# Patient Record
Sex: Female | Born: 1970 | Race: Asian | Hispanic: No | Marital: Married | Smoking: Never smoker
Health system: Southern US, Community
[De-identification: ages and names within clinical notes are randomized; demographics above are authoritative.]

## PROBLEM LIST (undated history)

## (undated) DIAGNOSIS — M25561 Pain in right knee: Secondary | ICD-10-CM

## (undated) DIAGNOSIS — K219 Gastro-esophageal reflux disease without esophagitis: Secondary | ICD-10-CM

## (undated) HISTORY — DX: Gastro-esophageal reflux disease without esophagitis: K21.9

## (undated) HISTORY — DX: Pain in right knee: M25.561

---

## 2015-09-19 ENCOUNTER — Encounter: Payer: Self-pay | Admitting: Osteopathic Medicine

## 2015-09-19 ENCOUNTER — Ambulatory Visit (INDEPENDENT_AMBULATORY_CARE_PROVIDER_SITE_OTHER): Payer: 59 | Admitting: Osteopathic Medicine

## 2015-09-19 VITALS — BP 132/76 | HR 90 | Ht 61.0 in | Wt 109.0 lb

## 2015-09-19 DIAGNOSIS — K219 Gastro-esophageal reflux disease without esophagitis: Secondary | ICD-10-CM

## 2015-09-19 DIAGNOSIS — Z23 Encounter for immunization: Secondary | ICD-10-CM

## 2015-09-19 DIAGNOSIS — M25561 Pain in right knee: Secondary | ICD-10-CM

## 2015-09-19 MED ORDER — FAMOTIDINE 40 MG PO TABS: 20.0000 mg | ORAL_TABLET | Freq: Two times a day (BID) | ORAL | Status: AC

## 2015-09-19 NOTE — Progress Notes (Signed)
HPI: Sara Hansen is a 44 y.o. female who presents to [place]  today for chief complaint of: HEE-YEN  Chief Complaint  Patient presents with  . Gastrophageal Reflux   . Location: epigastric pain . Quality: burning . Severity: moderate to severe . Duration: last few week . Timing: . Context: . Modifying factors: has been taking Nexium, would like a prescription for famotidine which was given to her by a friend and helped . Assoc signs/symptoms: no unusual foods or recent travel   R knee pain as well No previous injury Worse with getting up and walking. Duration: 1 month.  No falls.  Has taken aleve OTC for this.    Past medical, social and family history reviewed: History reviewed. No pertinent past medical history. History reviewed. No pertinent past surgical history. Social History  Substance Use Topics  . Smoking status: Not on file  . Smokeless tobacco: Not on file  . Alcohol Use: Not on file   History reviewed. No pertinent family history.   No Known Allergies    Review of Systems: CONSTITUTIONAL: Neg fever/chills, no unintentional weight changes HEAD/EYES/EARS/NOSE/THROAT: No headache/vision change or hearing change, no sore throat CARDIAC: No chest pain/pressure/palpitations, no orthopnea RESPIRATORY: No cough/shortness of breath/wheeze GASTROINTESTINAL: No nausea/vomiting/abdominal pain/blood in stool/diarrhea/constipation/coffee ground emesis. (+) HEARTBURN AS PER HPI, was initially having some vomiting however this has resolved, no episodes this past week MUSCULOSKELETAL: (+) myalgia/arthralgia r knee pain as per HPI GENITOURINARY: No incontinence, No abnormal genital bleeding/discharge SKIN: No rash/wounds/concerning lesions HEM/ONC: No easy bruising/bleeding, no abnormal lymph node ENDOCRINE: No polyuria/polydipsia/polyphagia, no heat/cold intolerance  NEUROLOGIC: No weakness/dizzines/slurred speech PSYCHIATRIC: No  concerns with depression/anxiety or sleep problems.    Exam:  BP 132/76 mmHg  Pulse 90  Ht 5' 1"  (1.549 m)  Wt 109 lb (49.442 kg)  BMI 20.61 kg/m2  Constitutional: VSS, see above. General Appearance: alert, well-developed, well-nourished, NAD Eyes: Normal lids and conjunctive, non-icteric sclera,  Respiratory: Normal respiratory effort. no wheeze/rhonchi/rales Cardiovascular: S1/S2 normal, no murmur/rub/gallop auscultated. RRR.  Gastrointestinal: Nontender, no masses. No hepatomegaly, no splenomegaly. No hernia appreciated. Bowel sounds normal. Rectal exam deferred.  Musculoskeletal: Gait normal. No clubbing/cyanosis of digits. R knee neg anterior drawer, neg lachman's, (-) valgus/varus stress, (-) McMurry (+) mild crepitus with flexion.  Neurological: No cranial nerve deficit on limited exam. Motor and sensation intact and symmetric Psychiatric: Normal judgment/insight. Normal mood and affect. Oriented x3.    No results found for this or any previous visit (from the past 72 hour(s)).    ASSESSMENT/PLAN:  Gastroesophageal reflux disease, esophagitis presence not specified - Plan: famotidine (PEPCID) 40 MG tablet  Right knee pain - Plan: DG Knee Complete 4 Views Right   Famotidine per pt request. Has taken PPI so can't do breath test today but would consider this vs PPI therapy if H2B not effective, advised bid use of 1/2 tabs, printed info given  Pt will think about getting knee XR today. Advised most likely arthritis, avoid OTC NSAID as these can irritate stomach

## 2015-09-19 NOTE — Patient Instructions (Signed)
IF YOUR HEARTBURN DOESN'T GET BETTER, PLEASE COME BACK TO CLINIC TO TALK MORE ABOUT OTHER MEDICATIONS OR TESTS. OTHERWISE, CAN FOLLOW UP FOR ANNUAL PHYSICAL IN THE NEXT 1 - 2 MONTHS. TAKE YOUR HEARTBURN MEDICATION AS DIRECTED FOR AT LEAST 8 - 12 WEEKS TO SEE IF THI WILL CUT DOWN ON THE STOMACH ACID AND MAKE YOUR HEARTBURN GO AWAY.    Gastroesophageal Reflux Disease, Adult Gastroesophageal reflux disease (GERD) happens when acid from your stomach goes into your food pipe (esophagus). The acid can cause a burning feeling in your chest. Over time, the acid can make small holes (ulcers) in your food pipe.  HOME CARE  Ask your doctor for advice about:  Losing weight.  Quitting smoking.  Alcohol use.  Avoid foods and drinks that make your problems worse. You may want to avoid:  Caffeine and alcohol.  Chocolate.  Mints.  Garlic and onions.  Spicy foods.  Citrus fruits, such as oranges, lemons, or limes.  Foods that contain tomato, such as sauce, chili, salsa, and pizza.  Fried and fatty foods.  Avoid lying down for 3 hours before you go to bed or before you take a nap.  Eat small meals often, instead of large meals.  Wear loose-fitting clothing. Do not wear anything tight around your waist.  Raise (elevate) the head of your bed 6 to 8 inches with wood blocks. Using extra pillows does not help.  Only take medicines as told by your doctor.  Do not take aspirin or ibuprofen. GET HELP RIGHT AWAY IF:   You have pain in your arms, neck, jaw, teeth, or back.  Your pain gets worse or changes.  You feel sick to your stomach (nauseous), throw up (vomit), or sweat (diaphoresis).  You feel short of breath, or you pass out (faint).  Your throw up is green, yellow, black, or looks like coffee grounds or blood.  Your poop (stool) is red, bloody, or black. MAKE SURE YOU:   Understand these instructions.  Will watch your condition.  Will get help right away if you are not doing  well or get worse. Document Released: 05/26/2008 Document Revised: 03/01/2012 Document Reviewed: 06/27/2011 [facility] Patient Information 2015 [place], [place]. This information is not intended to replace advice given to you by your health care provider. Make sure you discuss any questions you have with your health care provider.

## 2015-09-20 ENCOUNTER — Encounter: Payer: Self-pay | Admitting: Osteopathic Medicine

## 2015-09-20 DIAGNOSIS — M25561 Pain in right knee: Secondary | ICD-10-CM

## 2015-09-20 DIAGNOSIS — K219 Gastro-esophageal reflux disease without esophagitis: Secondary | ICD-10-CM

## 2015-09-20 HISTORY — DX: Gastro-esophageal reflux disease without esophagitis: K21.9

## 2015-09-20 HISTORY — DX: Pain in right knee: M25.561

## 2015-11-23 ENCOUNTER — Encounter: Payer: Self-pay | Admitting: Osteopathic Medicine

## 2015-11-23 ENCOUNTER — Ambulatory Visit (INDEPENDENT_AMBULATORY_CARE_PROVIDER_SITE_OTHER): Payer: 59 | Admitting: Osteopathic Medicine

## 2015-11-23 ENCOUNTER — Other Ambulatory Visit (HOSPITAL_COMMUNITY)
Admission: RE | Admit: 2015-11-23 | Discharge: 2015-11-23 | Disposition: A | Discharge status: Home / Self Care | Payer: 59 | Source: Ambulatory Visit | Attending: Osteopathic Medicine | Admitting: Osteopathic Medicine

## 2015-11-23 ENCOUNTER — Ambulatory Visit (INDEPENDENT_AMBULATORY_CARE_PROVIDER_SITE_OTHER): Payer: 59

## 2015-11-23 VITALS — BP 136/67 | HR 76 | Ht 61.0 in | Wt 108.0 lb

## 2015-11-23 DIAGNOSIS — Z113 Encounter for screening for infections with a predominantly sexual mode of transmission: Secondary | ICD-10-CM | POA: Insufficient documentation

## 2015-11-23 DIAGNOSIS — D509 Iron deficiency anemia, unspecified: Secondary | ICD-10-CM

## 2015-11-23 DIAGNOSIS — Z01419 Encounter for gynecological examination (general) (routine) without abnormal findings: Secondary | ICD-10-CM | POA: Diagnosis present

## 2015-11-23 DIAGNOSIS — M25561 Pain in right knee: Secondary | ICD-10-CM

## 2015-11-23 DIAGNOSIS — N760 Acute vaginitis: Secondary | ICD-10-CM | POA: Diagnosis present

## 2015-11-23 DIAGNOSIS — Z13220 Encounter for screening for lipoid disorders: Secondary | ICD-10-CM

## 2015-11-23 DIAGNOSIS — Z124 Encounter for screening for malignant neoplasm of cervix: Secondary | ICD-10-CM

## 2015-11-23 DIAGNOSIS — Z23 Encounter for immunization: Secondary | ICD-10-CM

## 2015-11-23 DIAGNOSIS — Z1151 Encounter for screening for human papillomavirus (HPV): Secondary | ICD-10-CM | POA: Insufficient documentation

## 2015-11-23 DIAGNOSIS — L409 Psoriasis, unspecified: Secondary | ICD-10-CM | POA: Insufficient documentation

## 2015-11-23 DIAGNOSIS — Z131 Encounter for screening for diabetes mellitus: Secondary | ICD-10-CM

## 2015-11-23 DIAGNOSIS — Z114 Encounter for screening for human immunodeficiency virus [HIV]: Secondary | ICD-10-CM

## 2015-11-23 DIAGNOSIS — B379 Candidiasis, unspecified: Secondary | ICD-10-CM

## 2015-11-23 DIAGNOSIS — Z1239 Encounter for other screening for malignant neoplasm of breast: Secondary | ICD-10-CM

## 2015-11-23 DIAGNOSIS — Z0000 Encounter for general adult medical examination without abnormal findings: Secondary | ICD-10-CM | POA: Diagnosis not present

## 2015-11-23 DIAGNOSIS — Z79899 Other long term (current) drug therapy: Secondary | ICD-10-CM

## 2015-11-23 DIAGNOSIS — Z862 Personal history of diseases of the blood and blood-forming organs and certain disorders involving the immune mechanism: Secondary | ICD-10-CM

## 2015-11-23 LAB — COMPLETE METABOLIC PANEL WITH GFR
ALT: 19 U/L (ref 6–29)
AST: 17 U/L (ref 10–30)
Albumin: 4.4 g/dL (ref 3.6–5.1)
Alkaline Phosphatase: 89 U/L (ref 33–115)
BILIRUBIN TOTAL: 1 mg/dL (ref 0.2–1.2)
BUN: 15 mg/dL (ref 7–25)
CHLORIDE: 107 mmol/L (ref 98–110)
CO2: 27 mmol/L (ref 20–31)
CREATININE: 0.4 mg/dL — AB (ref 0.50–1.10)
Calcium: 8.8 mg/dL (ref 8.6–10.2)
GFR, Est African American: >89 mL/min (ref >=60)
GFR, Est Non African American: >89 mL/min (ref >=60)
Glucose, Bld: 81 mg/dL (ref 65–99)
Potassium: 3.8 mmol/L (ref 3.5–5.3)
Sodium: 139 mmol/L (ref 135–146)
TOTAL PROTEIN: 6.9 g/dL (ref 6.1–8.1)

## 2015-11-23 LAB — CBC WITH DIFFERENTIAL/PLATELET
BASOS ABS: 0 10*3/uL (ref 0.0–0.1)
Basophils Relative: 1 % (ref 0–1)
EOS PCT: 1 % (ref 0–5)
Eosinophils Absolute: 0 10*3/uL (ref 0.0–0.7)
HEMATOCRIT: 26.5 % — AB (ref 36.0–46.0)
Hemoglobin: 7.2 g/dL — ABNORMAL LOW (ref 12.0–15.0)
LYMPHS ABS: 1.4 10*3/uL (ref 0.7–4.0)
LYMPHS PCT: 30 % (ref 12–46)
MCH: 16.5 pg — ABNORMAL LOW (ref 26.0–34.0)
MCHC: 27.2 g/dL — ABNORMAL LOW (ref 30.0–36.0)
MCV: 60.8 fL — ABNORMAL LOW (ref 78.0–100.0)
MONO ABS: 0.6 10*3/uL (ref 0.1–1.0)
MPV: 7.9 fL — ABNORMAL LOW (ref 8.6–12.4)
Monocytes Relative: 13 % — ABNORMAL HIGH (ref 3–12)
NEUTROS ABS: 2.5 10*3/uL (ref 1.7–7.7)
Neutrophils Relative %: 55 % (ref 43–77)
PLATELETS: 551 10*3/uL — AB (ref 150–400)
RBC: 4.36 MIL/uL (ref 3.87–5.11)
RDW: 18.8 % — AB (ref 11.5–15.5)
WBC: 4.6 10*3/uL (ref 4.0–10.5)

## 2015-11-23 LAB — LIPID PANEL
CHOL/HDL RATIO: 1.8 ratio (ref <=5.0)
Cholesterol: 168 mg/dL (ref 125–200)
HDL: 96 mg/dL (ref >=46)
LDL CALC: 63 mg/dL (ref <130)
Triglycerides: 45 mg/dL (ref <150)
VLDL: 9 mg/dL (ref <30)

## 2015-11-23 MED ORDER — FLUCONAZOLE 150 MG PO TABS: 150.0000 mg | ORAL_TABLET | Freq: Once | ORAL | Status: DC

## 2015-11-23 NOTE — Progress Notes (Signed)
HPI: Sara Hansen is a 44 y.o. female who presents to [place]  today for chief complaint of:  Chief Complaint  Patient presents with  . Annual Exam  . Gynecologic Exam   FEMALE PREVENTIVE CARE  ANNUAL SCREENING/COUNSELING Tobacco - Never  Alcohol - social drinker Diet/Exercise - HEALTHY HABITS DISCUSSED TO DECREASE CV RISK, patient walks and runs sometimes Sexual Health - No, not currently STI - The patient denies history of sexually transmitted disease. INTERESTED IN STI TESTING - yes Depression - PQH2 Negative Domestic violence concerns - no HTN SCREENING - SEE VITALS Vaccination status - SEE BELOW G3P3, no history GDM or GHTN  INFECTIOUS DISEASE SCREENING HIV - all adults 15-65 - needs GC/CT - sexually active - needs HepC - born 37-1965 - does not need TB - if risk/required by employer - does not need  DISEASE SCREENING Lipid - (Low risk screen M35/F45; High risk screen M25/F35 if HTN, Tob, FH CHD M<55/F<65) - needs DM2 (45+ or Risk = FH 1st deg DM, Hx GDM, overweight/sedentary, high-risk ethnicity, HTN) - needs  CANCER SCREENING Cervical - Pap q3 yr age 86+, Pap + HPV q5y age 3+ - PAP - needs, NILM last year but no HPV cotest done Breast - Mammo age 60+ (C) and biennial age 82-75 (A) - 66 - needs Colon - age 77+ or 44 years of age prior to [place] Dx - GI REFERRAL - does not need, no FH  ADULT VACCINATION Influenza - annual - was given last visit Td booster every 10 years - was given   Past medical, social and family history reviewed: Past Medical History  Diagnosis Date  . GERD (gastroesophageal reflux disease) 09/20/2015  . Right knee pain 09/20/2015   History reviewed. No pertinent past surgical history. Social History  Substance Use Topics  . Smoking status: Never Smoker   . Smokeless tobacco: Not on file  . Alcohol Use: 0.0 oz/week    0 Standard drinks or equivalent per week   History reviewed. No pertinent family  history.  Current Outpatient Prescriptions  Medication Sig Dispense Refill  . famotidine (PEPCID) 40 MG tablet Take 0.5 tablets (20 mg total) by mouth 2 (two) times daily. 90 tablet 1  .      . halobetasol (ULTRAVATE) 0.05 % ointment      No current facility-administered medications for this visit.   No Known Allergies    Review of Systems: CONSTITUTIONAL:  No  fever, no chills, No  unintentional weight changes HEAD/EYES/EARS/NOSE/THROAT: No headache, no vision change, no hearing change, No  sore throat CARDIAC: No chest pain, no pressure/palpitations, no orthopnea RESPIRATORY: No  cough, No  shortness of breath/wheeze GASTROINTESTINAL: No nausea, no vomiting, no abdominal pain, no blood in stool, no diarrhea, no constipation MUSCULOSKELETAL: Yes  Myalgia/arthralgia - knee pain persistent GENITOURINARY: No incontinence, No abnormal genital bleeding/discharge, think may have yeast infection SKIN: No rash/wounds/concerning lesions. (+) psoriatic plaques on back, groin, breasts, abdomen HEM/ONC: No easy bruising/bleeding, no abnormal lymph node ENDOCRINE: No polyuria/polydipsia/polyphagia, no heat/cold intolerance  NEUROLOGIC: No weakness, no dizziness, no slurred speech PSYCHIATRIC: No concerns with depression, no concerns with anxiety, no sleep problems    Exam:  BP 136/67 mmHg  Pulse 76  Ht 5' 1"  (1.549 m)  Wt 108 lb (48.988 kg)  BMI 20.42 kg/m2 Constitutional: VSS, see above. General Appearance: alert, well-developed, well-nourished, NAD Neck: No masses, trachea midline. No thyroid enlargement/tenderness/mass appreciated. No lymphadenopathy Respiratory: Normal respiratory effort. no wheeze,  no rhonchi, no rales Cardiovascular: S1/S2 normal, no murmur, no rub/gallop auscultated. RRR.  Gastrointestinal: Nontender, no masses.  Neurological: No cranial nerve deficit on limited exam. Motor and sensation intact and symmetric Psychiatric: Normal judgment/insight. Normal mood and  affect. Oriented x3.  GYN: No lesions/ulcers to external genitalia, normal urethra, normal vaginal mucosa, physiologic discharge plu swhitish discharge c/w yeast, cervix normal without lesions BREAST: No rashes/skin changes except psoriatic plaques around nipples, normal fibrous breast tissue, no masses or tenderness, normal nipple without discharge, normal axilla  No results found for this or any previous visit (from the past 72 hour(s)).    ASSESSMENT/PLAN: The patient instructions. We updated Pap screening today and blood work as below. Due for mammogram every 2 years, due in March 2017. Patient to go today for a knee x-ray, orders are already in the system. May consider follow-up with sports medicine based on x-ray results and pain. May benefit from injection. Patient has topical medications for psoriasis at home  Annual physical exam  Lipid screening - Plan: Lipid panel  Diabetes mellitus screening - Plan: COMPLETE METABOLIC PANEL WITH GFR  Screening for HIV (human immunodeficiency virus) - Plan: HIV antibody  Need for tetanus booster  Screening for cervical cancer - Plan: Cytology - PAP  Screening for breast cancer - due 02/2016  History of anemia - Plan: CBC with Differential/Platelet  Medication management - Plan: COMPLETE METABOLIC PANEL WITH GFR  Right knee pain  Yeast infection - Plan: fluconazole (DIFLUCAN) 150 MG tablet  Psoriasis   Return in about 1 year (around 11/22/2016), or if symptoms worsen or fail to improve, for ANNUAL PHYSICAL.

## 2015-11-23 NOTE — Patient Instructions (Addendum)
We should have the results of the Pap test and all the lab work done by next week, please call us if you do not hear anything about your results. If all results are normal, we do not need to see you again for a year for another annual checkup, but please come back to the office sooner if you have any problems or concerns.  Medicine for yeast infection has been sent to your pharmacy.  If knee pain persists, depending on the results of the x-ray it might be worth scheduling a visit with one of our sports medicine doctors in the practice, to discuss management of your knee pain and possible injections to help with the pain. We will talk about this more when I have the x-ray results back.

## 2015-11-26 ENCOUNTER — Other Ambulatory Visit: Payer: Self-pay | Admitting: Osteopathic Medicine

## 2015-11-26 DIAGNOSIS — D509 Iron deficiency anemia, unspecified: Secondary | ICD-10-CM

## 2015-11-26 LAB — CYTOLOGY - PAP

## 2015-11-26 LAB — HIV ANTIBODY (ROUTINE TESTING W REFLEX): HIV 1&2 Ab, 4th Generation: NONREACTIVE

## 2015-11-26 LAB — CERVICOVAGINAL ANCILLARY ONLY
BACTERIAL VAGINITIS: NEGATIVE
Candida vaginitis: NEGATIVE

## 2015-11-26 NOTE — Addendum Note (Signed)
Addended by: Maryla Morrow on: 11/26/2015 10:43 AM   Modules accepted: Orders

## 2017-05-14 LAB — HM MAMMOGRAPHY

## 2017-05-29 ENCOUNTER — Encounter: Payer: Self-pay | Admitting: Osteopathic Medicine

## 2017-07-23 ENCOUNTER — Ambulatory Visit (INDEPENDENT_AMBULATORY_CARE_PROVIDER_SITE_OTHER): Payer: 59

## 2017-07-23 ENCOUNTER — Ambulatory Visit (INDEPENDENT_AMBULATORY_CARE_PROVIDER_SITE_OTHER): Payer: 59 | Admitting: Osteopathic Medicine

## 2017-07-23 ENCOUNTER — Encounter: Payer: Self-pay | Admitting: Osteopathic Medicine

## 2017-07-23 VITALS — BP 152/93 | HR 87 | Ht 61.0 in | Wt 109.0 lb

## 2017-07-23 DIAGNOSIS — R05 Cough: Secondary | ICD-10-CM | POA: Diagnosis not present

## 2017-07-23 DIAGNOSIS — R5383 Other fatigue: Secondary | ICD-10-CM

## 2017-07-23 DIAGNOSIS — M7631 Iliotibial band syndrome, right leg: Secondary | ICD-10-CM

## 2017-07-23 DIAGNOSIS — R053 Chronic cough: Secondary | ICD-10-CM

## 2017-07-23 MED ORDER — MELOXICAM 7.5 MG PO TABS: 7.5000 mg | ORAL_TABLET | Freq: Every day | ORAL | 2 refills | Status: DC

## 2017-07-23 MED ORDER — OMEPRAZOLE 40 MG PO CPDR: 40.0000 mg | DELAYED_RELEASE_CAPSULE | Freq: Every day | ORAL | 0 refills | Status: AC

## 2017-07-23 NOTE — Progress Notes (Signed)
HPI: Sara Hansen is a 46 y.o. female  who presents to [place] today, 07/23/17,  for chief complaint of:  Chief Complaint  Patient presents with  . Unintentional weight loss, numbness and tingling, cough    Initially on the schedule for annual physical but sounds like she has some problem-based complaints that we need to address.  Weight loss: Patient has good appetite. Concerned about possible thyroid dysfunction. Weight no different based on our charts since 11/2015  Numbness and tingling in the right leg.   Chronic cough for the past 3 months or so. Dry coughing. No Hx allergies. OTC remedies tried: NyQuil, Theraflu, Robitussin. No fever/night sweats. (+)exposure to secondhand smoke - husband is a smoker.   Blood pressure elevated on intake.   (+)Depression screening: reports worry over her health    Past medical, surgical, social and family history reviewed: Patient Active Problem List   Diagnosis Date Noted  . Psoriasis 11/23/2015  . GERD (gastroesophageal reflux disease) 09/20/2015  . Right knee pain 09/20/2015   No past surgical history on file. Social History  Substance Use Topics  . Smoking status: Never Smoker  . Smokeless tobacco: Not on file  . Alcohol use 0.0 oz/week   No family history on file.   Current medication list and allergy/intolerance information reviewed:   Current Outpatient Prescriptions  Medication Sig Dispense Refill  . famotidine (PEPCID) 40 MG tablet Take 0.5 tablets (20 mg total) by mouth 2 (two) times daily. 90 tablet 1  . halobetasol (ULTRAVATE) 0.05 % ointment      No current facility-administered medications for this visit.    No Known Allergies    Review of Systems:  Constitutional:  No  fever, no chills, No recent illness, No unintentional weight changes. No significant fatigue.   HEENT: No  headache, no vision change, no hearing change, No sore throat, No  sinus pressure  Cardiac: No   chest pain, No  pressure, No palpitations  Respiratory:  No  shortness of breath. No  Cough  Gastrointestinal: No  abdominal pain, No  nausea, No  vomiting,  No  blood in stool, No  diarrhea, No  constipation   Musculoskeletal: No new myalgia/arthralgia  Skin: No  Rash  Neurologic: No  weakness, No  dizziness,  Psychiatric: No  concerns with depression, No  concerns with anxiety, No sleep problems, No mood problems   Exam:  BP (!) 152/93   Pulse 87   Ht 5' 1"  (1.549 m)   Wt 109 lb (49.4 kg)   LMP 07/10/2017   BMI 20.60 kg/m   Constitutional: VS see above. General Appearance: alert, well-developed, well-nourished, NAD  Eyes: Normal lids and conjunctive, non-icteric sclera  Ears, Nose, Mouth, Throat: MMM, Normal external inspection ears/nares/mouth/lips/gums.   Neck: No masses, trachea midline. No thyroid enlargement.   Respiratory: Normal respiratory effort. no wheeze, no rhonchi, no rales  Cardiovascular: S1/S2 normal, no murmur, no rub/gallop auscultated. RRR. No lower extremity edema.   Gastrointestinal: Nontender, no masses. No hepatomegaly, no splenomegaly. No hernia appreciated. Bowel sounds normal. Rectal exam deferred.   Musculoskeletal: Gait normal. No clubbing/cyanosis of digits.   Neurological: Normal balance/coordination. No tremor.   Skin: warm, dry, intact. No rash/ulcer.   Psychiatric: Normal judgment/insight. Normal mood and affect. Oriented x3.     Dg Chest 2 View  Result Date: 07/23/2017 CLINICAL DATA:  Which may reflect air trapping or could be voluntary. There is no evidence of pneumonia nor other acute  cardiopulmonary abnormality EXAM: CHEST  2 VIEW COMPARISON:  None in PACs FINDINGS: The lungs are mildly hyperinflated and clear. The heart and pulmonary vascularity are normal. The mediastinum is normal in width. There is no pleural effusion. The bony thorax is unremarkable. IMPRESSION: Mild hyperinflation may be voluntary or may reflect air  trapping. There is no pneumonia nor other acute cardiopulmonary abnormality. Electronically Signed   By: David  Martinique M.D.   On: 07/23/2017 14:20     ASSESSMENT/PLAN:   Chronic cough - Plan: omeprazole (PRILOSEC) 40 MG capsule, DG Chest 2 View  Iliotibial band syndrome of right side - Plan: meloxicam (MOBIC) 7.5 MG tablet  Fatigue, unspecified type - Plan: CBC, COMPLETE METABOLIC PANEL WITH GFR, Lipid panel, TSH, VITAMIN D 25 Hydroxy (Vit-D Deficiency, Fractures)    Patient Instructions  Plan:   I think cough may be due to acid reflux from the stomach. We are going to try a fairly high-dose of a medicine called omeprazole for the next 6-8 weeks to see if this helps. We are going to get a chest x-ray today as well. As long as Xray plus the labs are normal, let's give the medication some time to work and if it is not helpful please come back and see [place] in 6-8 weeks.   I sent some anti-inflammatory medication and printed some home exercises for the leg. If these are not helping, please make an appointment to see one of our sports medicine doctors, Dr. Darene Lamer or Dr. Georgina Snell, for further evaluation   We'll get labs to make sure no dangerous causes of weight loss. Plan to recheck your weight at your follow-up visit in 6-8 weeks.    Visit summary with medication list and pertinent instructions was printed for patient to review. All questions at time of visit were answered - patient instructed to contact office with any additional concerns. ER/RTC precautions were reviewed with the patient. Follow-up plan: Return in about 6 weeks (around 09/03/2017) for recheck weight and cough, sooner if needed. Marland Kitchen

## 2017-07-23 NOTE — Patient Instructions (Addendum)
Plan:   I think cough may be due to acid reflux from the stomach. We are going to try a fairly high-dose of a medicine called omeprazole for the next 6-8 weeks to see if this helps. We are going to get a chest x-ray today as well. As long as Xray plus the labs are normal, let's give the medication some time to work and if it is not helpful please come back and see [place] in 6-8 weeks.   I sent some anti-inflammatory medication and printed some home exercises for the leg. If these are not helping, please make an appointment to see one of our sports medicine doctors, Dr. Darene Lamer or Dr. Georgina Snell, for further evaluation   We'll get labs to make sure no dangerous causes of weight loss. Plan to recheck your weight at your follow-up visit in 6-8 weeks.

## 2017-07-24 DIAGNOSIS — R5383 Other fatigue: Secondary | ICD-10-CM | POA: Insufficient documentation

## 2017-07-24 DIAGNOSIS — R05 Cough: Secondary | ICD-10-CM | POA: Insufficient documentation

## 2017-07-24 DIAGNOSIS — M7631 Iliotibial band syndrome, right leg: Secondary | ICD-10-CM | POA: Insufficient documentation

## 2017-07-24 DIAGNOSIS — R053 Chronic cough: Secondary | ICD-10-CM | POA: Insufficient documentation

## 2017-07-28 ENCOUNTER — Encounter: Payer: Self-pay | Admitting: Osteopathic Medicine

## 2017-07-29 MED ORDER — BENZONATATE 200 MG PO CAPS: 200.0000 mg | ORAL_CAPSULE | Freq: Three times a day (TID) | ORAL | 0 refills | Status: DC | PRN

## 2017-09-07 ENCOUNTER — Ambulatory Visit: Payer: 59

## 2018-05-19 ENCOUNTER — Encounter: Payer: Self-pay | Admitting: Osteopathic Medicine

## 2018-12-02 ENCOUNTER — Other Ambulatory Visit: Payer: Self-pay | Admitting: Osteopathic Medicine

## 2018-12-02 DIAGNOSIS — M7631 Iliotibial band syndrome, right leg: Secondary | ICD-10-CM

## 2018-12-04 ENCOUNTER — Other Ambulatory Visit: Payer: Self-pay | Admitting: Osteopathic Medicine

## 2018-12-04 DIAGNOSIS — M7631 Iliotibial band syndrome, right leg: Secondary | ICD-10-CM

## 2018-12-06 ENCOUNTER — Other Ambulatory Visit: Payer: Self-pay | Admitting: Osteopathic Medicine

## 2018-12-06 DIAGNOSIS — M7631 Iliotibial band syndrome, right leg: Secondary | ICD-10-CM

## 2018-12-06 IMAGING — DX DG CHEST 2V
2 series · 2 of 2 positions shown · non-contrast
Comparison: None in PACs

CLINICAL DATA: Which may reflect air trapping or could be
voluntary. There is no evidence of pneumonia nor other acute
cardiopulmonary abnormality

EXAM:
CHEST  2 VIEW

[chest pa]
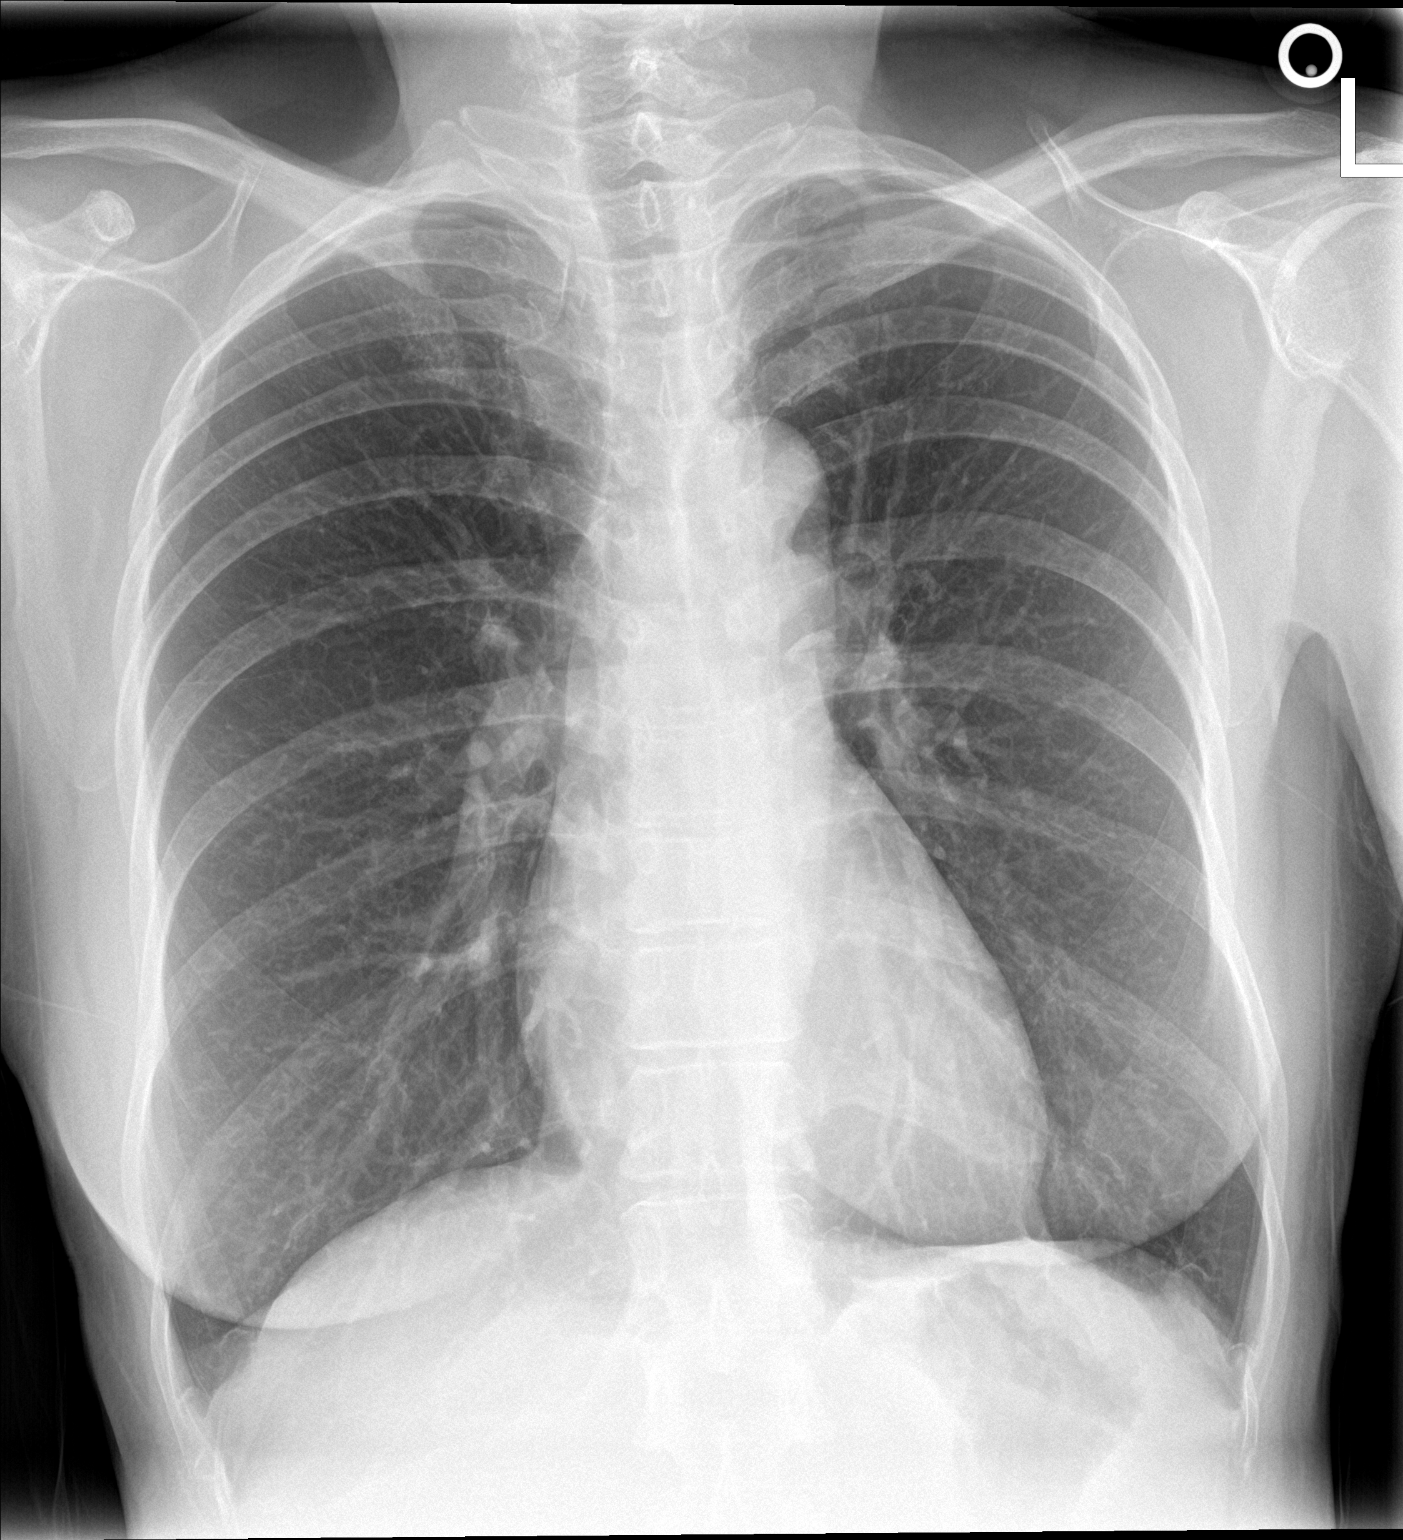

[chest lat]
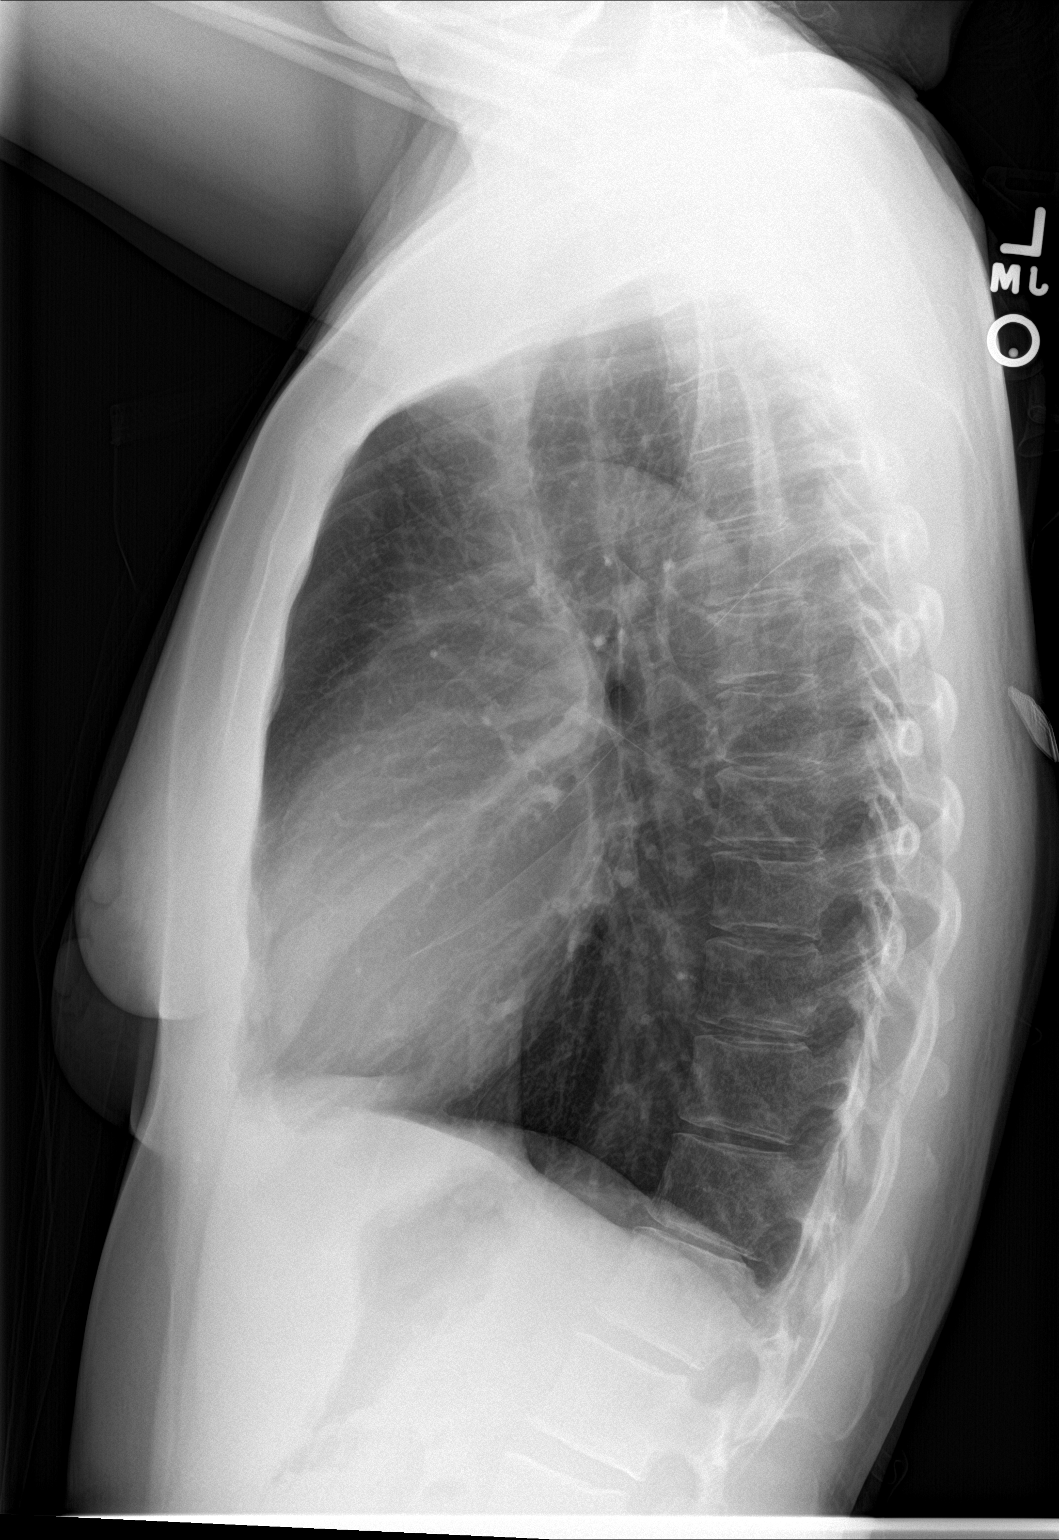

[2 of 2 positions shown; findings below may reference images not displayed]

FINDINGS: The lungs are mildly hyperinflated and clear. The heart and
pulmonary vascularity are normal. The mediastinum is normal in
width. There is no pleural effusion. The bony thorax is
unremarkable.
IMPRESSION: Mild hyperinflation may be voluntary or may reflect air trapping.
There is no pneumonia nor other acute cardiopulmonary abnormality.

## 2018-12-06 NOTE — Telephone Encounter (Signed)
Requested medication (s) are due for refill today: No  Requested medication (s) are on the active medication list: Yes  Last refill:  12/03/18  Future visit scheduled: No  Notes to clinic:  Medication already refilled.    Requested Prescriptions  Pending Prescriptions Disp Refills   meloxicam (MOBIC) 7.5 MG tablet [Pharmacy Med Name: MELOXICAM 7.5MG     TAB] 30 tablet 0    Sig: TAKE 1 TABLET BY MOUTH ONCE DAILY     Analgesics:  COX2 Inhibitors Failed - 12/04/2018  2:34 PM      Failed - HGB in normal range and within 360 days    Hemoglobin  Date Value Ref Range Status  11/23/2015 7.2 (L) 12.0 - 15.0 g/dL Final         Failed - Cr in normal range and within 360 days    Creat  Date Value Ref Range Status  11/23/2015 0.40 (L) 0.50 - 1.10 mg/dL Final         Failed - Valid encounter within last 12 months    Recent Outpatient Visits          1 year ago Chronic cough   [facility], Lanelle Bal, DO   3 years ago Annual physical exam   [facility], Lanelle Bal, DO   3 years ago Gastroesophageal reflux disease, esophagitis presence not specified   [facility], Lanelle Bal, DO             Passed - Patient is not pregnant

## 2019-05-24 LAB — HM MAMMOGRAPHY

## 2019-06-23 ENCOUNTER — Encounter: Payer: Self-pay | Admitting: Osteopathic Medicine

## 2020-04-02 ENCOUNTER — Encounter: Payer: Self-pay | Admitting: Osteopathic Medicine

## 2020-04-02 ENCOUNTER — Telehealth (INDEPENDENT_AMBULATORY_CARE_PROVIDER_SITE_OTHER): Payer: 59 | Admitting: Osteopathic Medicine

## 2020-04-02 VITALS — Wt 118.0 lb

## 2020-04-02 DIAGNOSIS — R21 Rash and other nonspecific skin eruption: Secondary | ICD-10-CM

## 2020-04-02 MED ORDER — BETAMETHASONE DIPROPIONATE 0.05 % EX CREA: TOPICAL_CREAM | Freq: Two times a day (BID) | CUTANEOUS | 1 refills | Status: AC

## 2020-04-02 NOTE — Progress Notes (Signed)
Pt states she had COVID vaccine x2 weeks in left arm.  Started to see rash x1 week in right arm. Rash is now on both arms and is spreading down the arm. Rash is red and has blisters. Rash looks like shingles.

## 2020-04-02 NOTE — Progress Notes (Signed)
Virtual Visit via Video (App used: Doximity) Note  I connected with      Sara Hansen on 04/02/20 at 1:04 PM  by a telemedicine application and verified that I am speaking with the correct person using two identifiers.  Patient is at home I am in office   I discussed the limitations of evaluation and management by telemedicine and the availability of in person appointments. The patient expressed understanding and agreed to proceed.  History of Present Illness: Sara Hansen is a 49 y.o. female who would like to discuss rash   Pt states she had COVID vaccine 03/12/2020 in left arm.  Started to see rash 1 week later in right arm. Rash is now on both arms and is spreading down the arm. Rash is red and has blisters. Pt concerned about Shingles.  No burning pain.  Tried Benadryl and calamine which helped Rash is itching.  Just on arms and nowhere else Cannot recall any exposure/trigger    Observations/Objective: Wt 118 lb (53.5 kg)   BMI 22.30 kg/m  BP Readings from Last 3 Encounters:  07/23/17 (!) 152/93  11/23/15 136/67  09/19/15 132/76   Exam: Normal Speech.  Rash difficult to adequately examine via poor resolutoin video but does not appear c/w Shingles, looks erythematous, maculopapular, diffuse on both arms, no ulcerations/blisters, no annular lesions   Lab and Radiology Results No results found for this or any previous visit (from the past 68 hour(s)). No results found.     Assessment and Plan: 49 y.o. female with The encounter diagnosis was Rash and nonspecific skin eruption.  Seems more likely contact dermatitis than a vaccine- or drug-related reaction Topical steroids ok Will hold off on po steroids at least for now, so we can hopefully have good immune response to vaccination  I advised probably fine to get second vaccine but If there is active rash we might wait so we aren't injecting through a rash  Pt to let me know how she responds to the steroids over the  next few days.   PDMP not reviewed this encounter. No orders of the defined types were placed in this encounter.  Meds ordered this encounter  Medications  . betamethasone dipropionate 0.05 % cream    Sig: Apply topically 2 (two) times daily. To affected area(s) as needed    Dispense:  90 g    Refill:  1      Follow Up Instructions: Return if symptoms worsen or fail to improve.    I discussed the assessment and treatment plan with the patient. The patient was provided an opportunity to ask questions and all were answered. The patient agreed with the plan and demonstrated an understanding of the instructions.   The patient was advised to call back or seek an in-person evaluation if any new concerns, if symptoms worsen or if the condition fails to improve as anticipated.  30 minutes of non-face-to-face time was provided during this encounter.      . . . . . . . . . . . . . Marland Kitchen                   Historical information moved to improve visibility of documentation.  Past Medical History:  Diagnosis Date  . GERD (gastroesophageal reflux disease) 09/20/2015  . Right knee pain 09/20/2015   No past surgical history on file. Social History   Tobacco Use  . Smoking status: Never Smoker  . Smokeless tobacco: Never Used  Substance Use Topics  . Alcohol use: Yes    Alcohol/week: 0.0 standard drinks   family history is not on file.  Medications: Current Outpatient Medications  Medication Sig Dispense Refill  . halobetasol (ULTRAVATE) 0.05 % ointment     . Multiple Vitamin (MULTI-VITAMIN) tablet Take by mouth.    . benzonatate (TESSALON) 200 MG capsule Take 1 capsule (200 mg total) by mouth 3 (three) times daily as needed for cough. (Patient not taking: Reported on 04/02/2020) 90 capsule 0  . famotidine (PEPCID) 40 MG tablet Take 0.5 tablets (20 mg total) by mouth 2 (two) times daily. (Patient not taking: Reported on 04/02/2020) 90 tablet 1  . meloxicam  (MOBIC) 7.5 MG tablet TAKE 1 TABLET BY MOUTH ONCE DAILY (Patient not taking: Reported on 04/02/2020) 30 tablet 0  . omeprazole (PRILOSEC) 40 MG capsule Take 1 capsule (40 mg total) by mouth daily. For 6-8 weeks (Patient not taking: Reported on 04/02/2020) 60 capsule 0   No current facility-administered medications for this visit.   No Known Allergies

## 2020-04-03 ENCOUNTER — Encounter: Payer: Self-pay | Admitting: Osteopathic Medicine

## 2020-04-05 ENCOUNTER — Other Ambulatory Visit: Payer: Self-pay

## 2020-04-05 ENCOUNTER — Encounter: Payer: Self-pay | Admitting: Osteopathic Medicine

## 2020-04-05 ENCOUNTER — Ambulatory Visit (INDEPENDENT_AMBULATORY_CARE_PROVIDER_SITE_OTHER): Payer: 59 | Admitting: Osteopathic Medicine

## 2020-04-05 VITALS — BP 138/82 | HR 69 | Wt 121.0 lb

## 2020-04-05 DIAGNOSIS — R21 Rash and other nonspecific skin eruption: Secondary | ICD-10-CM

## 2020-04-05 MED ORDER — METHYLPREDNISOLONE SODIUM SUCC 125 MG IJ SOLR
125.0000 mg | Freq: Once | INTRAMUSCULAR | Status: AC
  Administered 2020-04-05: 125 mg via INTRAMUSCULAR

## 2020-04-05 MED ORDER — HYDROXYZINE HCL 25 MG PO TABS: 25.0000 mg | ORAL_TABLET | Freq: Three times a day (TID) | ORAL | 1 refills | Status: AC | PRN

## 2020-04-05 MED ORDER — PREDNISONE 10 MG (48) PO TBPK: ORAL_TABLET | Freq: Every day | ORAL | 0 refills | Status: DC

## 2020-04-05 NOTE — Progress Notes (Signed)
Sara Hansen is a 49 y.o. female who presents to  [place] at [facility]  today, 04/05/20, seeking care for the following: . Rash - failed conservative tx and topical steroids, was just on arms and now spreading      [place] with other pertinent history/findings:  The encounter diagnosis was Rash and nonspecific skin eruption.            Patient Instructions  I think this may be a reaction to the vaccine, but hard to say for sure. I think it's an allergy to something.   Ideally, we would not want to give steroids this close to a vaccination because in theory it may decrease the vaccine's effectiveness, but I think we are going to need to do something for this rash. Steroid shot today, I sent oral steroids prednisone to start TOMORROW and hydroxyzine for the itching to start TODAY. The hydroxyzine may make you sleepy.   We might consider vaccinating again with a different COVID vaccine in the future, just to be safe, but let's wait a few months!         No orders of the defined types were placed in this encounter.   Meds ordered this encounter  Medications  . predniSONE (STERAPRED UNI-PAK 48 TAB) 10 MG (48) TBPK tablet    Sig: Take by mouth daily. 12-Day taper, po    Dispense:  48 tablet    Refill:  0  . hydrOXYzine (ATARAX/VISTARIL) 25 MG tablet    Sig: Take 1-2 tablets (25-50 mg total) by mouth every 8 (eight) hours as needed for itching.    Dispense:  60 tablet    Refill:  1       Follow-up instructions: Return in about 1 week (around 04/12/2020) for IN-OFFICE VISIT RECHECK RASH ([place] TO [place]) - SEE ME SOONER IF NEEDED .                                         BP 138/82 (BP Location: Left Arm, Patient Position: Sitting, Cuff Size: Normal)   Pulse 69   Wt 121 lb (54.9 kg)   SpO2 100%   BMI 22.86 kg/m   Current Meds  Medication Sig  .  benzonatate (TESSALON) 200 MG capsule Take 1 capsule (200 mg total) by mouth 3 (three) times daily as needed for cough.  . betamethasone dipropionate 0.05 % cream Apply topically 2 (two) times daily. To affected area(s) as needed  . famotidine (PEPCID) 40 MG tablet Take 0.5 tablets (20 mg total) by mouth 2 (two) times daily.  . halobetasol (ULTRAVATE) 0.05 % ointment   . meloxicam (MOBIC) 7.5 MG tablet TAKE 1 TABLET BY MOUTH ONCE DAILY  . Multiple Vitamin (MULTI-VITAMIN) tablet Take by mouth.  Marland Kitchen omeprazole (PRILOSEC) 40 MG capsule Take 1 capsule (40 mg total) by mouth daily. For 6-8 weeks    No results found for this or any previous visit (from the past 72 hour(s)).  No results found.  Depression screen PHQ 2/9 07/23/2017  Decreased Interest 2  Down, Depressed, Hopeless 2  PHQ - 2 Score 4  Altered sleeping 0  Tired, decreased energy 3  Change in appetite 3  Feeling bad or failure about yourself  0  Trouble concentrating 2  Moving slowly or fidgety/restless 0  Suicidal thoughts 0  PHQ-9 Score 12  No flowsheet data found.    All questions at time of visit were answered - patient instructed to contact office with any additional concerns or updates.  ER/RTC precautions were reviewed with the patient.  Please note: voice recognition software was used to produce this document, and typos may escape review. Please contact Dr. Sheppard Coil for any needed clarifications.

## 2020-04-05 NOTE — Patient Instructions (Addendum)
I think this may be a reaction to the vaccine, but hard to say for sure. I think it's an allergy to something.   Ideally, we would not want to give steroids this close to a vaccination because in theory it may decrease the vaccine's effectiveness, but I think we are going to need to do something for this rash. Steroid shot today, I sent oral steroids prednisone to start TOMORROW and hydroxyzine for the itching to start TODAY. The hydroxyzine may make you sleepy.   We might consider vaccinating again with a different COVID vaccine in the future, just to be safe, but let's wait a few months!

## 2020-04-17 ENCOUNTER — Ambulatory Visit (INDEPENDENT_AMBULATORY_CARE_PROVIDER_SITE_OTHER): Payer: 59 | Admitting: Osteopathic Medicine

## 2020-04-17 ENCOUNTER — Other Ambulatory Visit: Payer: Self-pay

## 2020-04-17 ENCOUNTER — Encounter: Payer: Self-pay | Admitting: Osteopathic Medicine

## 2020-04-17 VITALS — BP 138/86 | HR 80 | Temp 98.4°F | Wt 123.0 lb

## 2020-04-17 DIAGNOSIS — R21 Rash and other nonspecific skin eruption: Secondary | ICD-10-CM

## 2020-04-18 NOTE — Progress Notes (Signed)
Avree Gagan is a 49 y.o. female who presents to  [place] at [facility]  today, 04/17/20, seeking care for the following: . Rash f/u      ASSESSMENT & PLAN with other pertinent history/findings:  The encounter diagnosis was Rash and nonspecific skin eruption.  Much improved, steroid seem to have cleared things up.  Unfortunately, I do think this may have been a reaction to the Covid vaccine, hopefully steroids have been blunted her immune response to significantly, patient was advised might consider [employer] vaccine in the future.  Follow-up instructions: Return for recheck as needed / when due for annual .                                         BP 138/86 (BP Location: Left Arm, Patient Position: Sitting, Cuff Size: Normal)   Pulse 80   Temp 98.4 F (36.9 C) (Oral)   Wt 123 lb 0.6 oz (55.8 kg)   BMI 23.25 kg/m   Current Meds  Medication Sig  . benzonatate (TESSALON) 200 MG capsule Take 1 capsule (200 mg total) by mouth 3 (three) times daily as needed for cough.  . betamethasone dipropionate 0.05 % cream Apply topically 2 (two) times daily. To affected area(s) as needed  . famotidine (PEPCID) 40 MG tablet Take 0.5 tablets (20 mg total) by mouth 2 (two) times daily.  . halobetasol (ULTRAVATE) 0.05 % ointment   . hydrOXYzine (ATARAX/VISTARIL) 25 MG tablet Take 1-2 tablets (25-50 mg total) by mouth every 8 (eight) hours as needed for itching.  . meloxicam (MOBIC) 7.5 MG tablet TAKE 1 TABLET BY MOUTH ONCE DAILY  . Multiple Vitamin (MULTI-VITAMIN) tablet Take by mouth.  Marland Kitchen omeprazole (PRILOSEC) 40 MG capsule Take 1 capsule (40 mg total) by mouth daily. For 6-8 weeks  . predniSONE (STERAPRED UNI-PAK 48 TAB) 10 MG (48) TBPK tablet Take by mouth daily. 12-Day taper, po    No results found for this or any previous visit (from the past 72 hour(s)).  No results found.  Depression screen PHQ 2/9  07/23/2017  Decreased Interest 2  Down, Depressed, Hopeless 2  PHQ - 2 Score 4  Altered sleeping 0  Tired, decreased energy 3  Change in appetite 3  Feeling bad or failure about yourself  0  Trouble concentrating 2  Moving slowly or fidgety/restless 0  Suicidal thoughts 0  PHQ-9 Score 12    No flowsheet data found.    All questions at time of visit were answered - patient instructed to contact office with any additional concerns or updates.  ER/RTC precautions were reviewed with the patient.  Please note: voice recognition software was used to produce this document, and typos may escape review. Please contact Dr. Sheppard Coil for any needed clarifications.   Total encounter time: 37mnutes.

## 2020-05-15 ENCOUNTER — Encounter: Payer: 59 | Admitting: Osteopathic Medicine

## 2020-05-24 LAB — HM MAMMOGRAPHY

## 2020-05-28 ENCOUNTER — Encounter: Payer: Self-pay | Admitting: Osteopathic Medicine

## 2020-05-28 LAB — HM MAMMOGRAPHY

## 2020-05-31 ENCOUNTER — Encounter: Payer: Self-pay | Admitting: Osteopathic Medicine

## 2021-04-18 ENCOUNTER — Encounter: Payer: Self-pay | Admitting: Osteopathic Medicine

## 2021-04-18 DIAGNOSIS — Z0000 Encounter for general adult medical examination without abnormal findings: Secondary | ICD-10-CM

## 2021-04-19 NOTE — Telephone Encounter (Signed)
Those labs look good!

## 2021-04-19 NOTE — Telephone Encounter (Signed)
Routing to covering provider. Annual labs pended. Does covering provider want to add other testing/dx?

## 2021-05-16 ENCOUNTER — Encounter: Payer: 59 | Admitting: Osteopathic Medicine

## 2021-05-28 LAB — HM MAMMOGRAPHY

## 2021-06-10 ENCOUNTER — Other Ambulatory Visit: Payer: Self-pay

## 2021-06-10 ENCOUNTER — Encounter: Payer: Self-pay | Admitting: Osteopathic Medicine

## 2021-06-10 ENCOUNTER — Ambulatory Visit (INDEPENDENT_AMBULATORY_CARE_PROVIDER_SITE_OTHER): Payer: 59 | Admitting: Osteopathic Medicine

## 2021-06-10 ENCOUNTER — Other Ambulatory Visit (HOSPITAL_COMMUNITY)
Admission: RE | Admit: 2021-06-10 | Discharge: 2021-06-10 | Disposition: A | Discharge status: Home / Self Care | Payer: 59 | Source: Ambulatory Visit | Attending: Osteopathic Medicine | Admitting: Osteopathic Medicine

## 2021-06-10 VITALS — BP 153/97 | HR 74 | Temp 97.2°F | Wt 131.1 lb

## 2021-06-10 DIAGNOSIS — Z1211 Encounter for screening for malignant neoplasm of colon: Secondary | ICD-10-CM

## 2021-06-10 DIAGNOSIS — Z0000 Encounter for general adult medical examination without abnormal findings: Secondary | ICD-10-CM | POA: Diagnosis not present

## 2021-06-10 DIAGNOSIS — Z124 Encounter for screening for malignant neoplasm of cervix: Secondary | ICD-10-CM | POA: Insufficient documentation

## 2021-06-10 DIAGNOSIS — I10 Essential (primary) hypertension: Secondary | ICD-10-CM

## 2021-06-10 MED ORDER — BENZONATATE 200 MG PO CAPS: 200.0000 mg | ORAL_CAPSULE | Freq: Three times a day (TID) | ORAL | 0 refills | Status: AC | PRN

## 2021-06-10 NOTE — Progress Notes (Signed)
Sara Hansen is a 50 y.o. female who presents to  [place] at [facility]  today, 06/10/21, seeking care for the following:  Annual physical      [place] with other pertinent findings:  The primary encounter diagnosis was Annual physical exam. Diagnoses of Screening for colon cancer, Screening for cervical cancer, and Essential hypertension were also pertinent to this visit.   1. Annual physical exam Pap completed today Recent mammo COloguard ordered Labs ordered   2. Screening for colon cancer 3. Screening for cervical cancer   4. Essential hypertension BP Readings from Last 3 Encounters:  06/10/21 (!) 153/97  04/17/20 138/86  04/05/20 138/82  Advised patient to monitor BP at home, discussed goal 130/80, see [place] if high at home, pt agress to check BP few times per week in next 1-2 weeks     Orders Placed This Encounter  Procedures   Cologuard   CBC   COMPLETE METABOLIC PANEL WITH GFR   Lipid panel   TSH    Meds ordered this encounter  Medications   benzonatate (TESSALON) 200 MG capsule    Sig: Take 1 capsule (200 mg total) by mouth 3 (three) times daily as needed for cough.    Dispense:  90 capsule    Refill:  0     See below for relevant physical exam findings  See below for recent lab and imaging results reviewed  Medications, allergies, PMH, PSH, SocH, FamH reviewed below    Follow-up instructions: Return in about 1 year (around 06/10/2022) for [place] (call week prior to visit for lab orders).                                        Exam:  BP (!) 153/97 (BP Location: Left Arm, Patient Position: Sitting, Cuff Size: Normal)   Pulse 74   Temp (!) 97.2 F (36.2 C) (Oral)   Wt 131 lb 1.9 oz (59.5 kg)   BMI 24.77 kg/m  Constitutional: VS see above. General Appearance: alert, well-developed, well-nourished, NAD Neck: No masses, trachea midline.  Respiratory: Normal  respiratory effort. no wheeze, no rhonchi, no rales Cardiovascular: S1/S2 normal, no murmur, no rub/gallop auscultated. RRR.  Musculoskeletal: Gait normal. Symmetric and independent movement of all extremities Abdominal: non-tender, non-distended, no appreciable organomegaly, neg Murphy's, BS WNLx4 Neurological: Normal balance/coordination. No tremor. Skin: warm, dry, intact.  Psychiatric: Normal judgment/insight. Normal mood and affect. Oriented x3.  GYN: No lesions/ulcers to external genitalia, normal urethra, normal vaginal mucosa, physiologic discharge, cervix normal without lesions, uterus not enlarged or tender, adnexa no masses and nontender BREAST: No rashes/skin changes, normal fibrous breast tissue, no masses or tenderness, normal nipple without discharge, normal axilla   Current Meds  Medication Sig   betamethasone dipropionate 0.05 % cream Apply topically 2 (two) times daily. To affected area(s) as needed   famotidine (PEPCID) 40 MG tablet Take 0.5 tablets (20 mg total) by mouth 2 (two) times daily.   halobetasol (ULTRAVATE) 0.05 % ointment    hydrOXYzine (ATARAX/VISTARIL) 25 MG tablet Take 1-2 tablets (25-50 mg total) by mouth every 8 (eight) hours as needed for itching.   meloxicam (MOBIC) 7.5 MG tablet TAKE 1 TABLET BY MOUTH ONCE DAILY   Multiple Vitamin (MULTI-VITAMIN) tablet Take by mouth.   omeprazole (PRILOSEC) 40 MG capsule Take 1 capsule (40 mg total) by mouth daily. For  6-8 weeks    No Known Allergies  Patient Active Problem List   Diagnosis Date Noted   Chronic cough 07/24/2017   Iliotibial band syndrome of right side 07/24/2017   Fatigue 07/24/2017   Psoriasis 11/23/2015   GERD (gastroesophageal reflux disease) 09/20/2015   Right knee pain 09/20/2015    History reviewed. No pertinent family history.  Social History   Tobacco Use  Smoking Status Never  Smokeless Tobacco Never    History reviewed. No pertinent surgical history.  Immunization History   Administered Date(s) Administered   Influenza,inj,Quad PF,6+ Mos 09/19/2015   Influenza,inj,quad, With Preservative 09/25/2020   PFIZER(Purple Top)SARS-COV-2 Vaccination 03/12/2020, 04/04/2020   Tdap 11/23/2015    No results found for this or any previous visit (from the past 2160 hour(s)).  No results found.     All questions at time of visit were answered - patient instructed to contact office with any additional concerns or updates. ER/RTC precautions were reviewed with the patient as applicable.   Please note: manual typing as well as voice recognition software may have been used to produce this document - typos may escape review. Please contact Dr. Sheppard Coil for any needed clarifications.

## 2021-06-10 NOTE — Patient Instructions (Signed)
Since computer update, patient instructions are not coming through or printing properly on the After Visit Summary. Please see attached for patient instructions from today's visit!

## 2021-06-11 LAB — COMPLETE METABOLIC PANEL WITH GFR
AG Ratio: 1.5 (calc) (ref 1.0–2.5)
ALT: 40 U/L — ABNORMAL HIGH (ref 6–29)
AST: 32 U/L (ref 10–35)
Albumin: 4.8 g/dL (ref 3.6–5.1)
Alkaline phosphatase (APISO): 169 U/L — ABNORMAL HIGH (ref 31–125)
BUN/Creatinine Ratio: 31 (calc) — ABNORMAL HIGH (ref 6–22)
BUN: 15 mg/dL (ref 7–25)
CO2: 28 mmol/L (ref 20–32)
Calcium: 9.9 mg/dL (ref 8.6–10.2)
Chloride: 100 mmol/L (ref 98–110)
Creat: 0.49 mg/dL — ABNORMAL LOW (ref 0.50–1.10)
GFR, Est African American: 133 mL/min/{1.73_m2} (ref >=60)
GFR, Est Non African American: 114 mL/min/{1.73_m2} (ref >=60)
Globulin: 3.2 g/dL (calc) (ref 1.9–3.7)
Glucose, Bld: 93 mg/dL (ref 65–99)
Potassium: 5.1 mmol/L (ref 3.5–5.3)
Sodium: 137 mmol/L (ref 135–146)
Total Bilirubin: 1.2 mg/dL (ref 0.2–1.2)
Total Protein: 8 g/dL (ref 6.1–8.1)

## 2021-06-11 LAB — CBC
HCT: 47.2 % — ABNORMAL HIGH (ref 35.0–45.0)
Hemoglobin: 15.9 g/dL — ABNORMAL HIGH (ref 11.7–15.5)
MCH: 29.6 pg (ref 27.0–33.0)
MCHC: 33.7 g/dL (ref 32.0–36.0)
MCV: 87.9 fL (ref 80.0–100.0)
MPV: 9.4 fL (ref 7.5–12.5)
Platelets: 309 10*3/uL (ref 140–400)
RBC: 5.37 10*6/uL — ABNORMAL HIGH (ref 3.80–5.10)
RDW: 13.4 % (ref 11.0–15.0)
WBC: 5.6 10*3/uL (ref 3.8–10.8)

## 2021-06-11 LAB — LIPID PANEL
Cholesterol: 323 mg/dL — ABNORMAL HIGH (ref <200)
HDL: 84 mg/dL (ref >=50)
LDL Cholesterol (Calc): 208 mg/dL (calc) — ABNORMAL HIGH
Non-HDL Cholesterol (Calc): 239 mg/dL (calc) — ABNORMAL HIGH (ref <130)
Total CHOL/HDL Ratio: 3.8 (calc) (ref <5.0)
Triglycerides: 151 mg/dL — ABNORMAL HIGH (ref <150)

## 2021-06-11 LAB — TSH: TSH: 2.31 mIU/L

## 2021-06-12 LAB — CYTOLOGY - PAP
Comment: NEGATIVE
Diagnosis: NEGATIVE
High risk HPV: NEGATIVE

## 2021-06-26 ENCOUNTER — Ambulatory Visit: Payer: 59

## 2021-06-29 LAB — COLOGUARD: Cologuard: NEGATIVE

## 2021-07-29 ENCOUNTER — Encounter: Payer: Self-pay | Admitting: Osteopathic Medicine

## 2021-07-29 DIAGNOSIS — E782 Mixed hyperlipidemia: Secondary | ICD-10-CM

## 2021-07-29 DIAGNOSIS — R748 Abnormal levels of other serum enzymes: Secondary | ICD-10-CM

## 2021-08-19 ENCOUNTER — Encounter: Payer: Self-pay | Admitting: Osteopathic Medicine

## 2021-08-19 NOTE — Telephone Encounter (Signed)
-----   Message from Emeterio Reeve, DO sent at 06/12/2021  5:06 PM EDT ----- Recheck CMP and lipids

## 2021-11-25 ENCOUNTER — Ambulatory Visit: Payer: 59

## 2022-08-27 NOTE — Progress Notes (Signed)
New Patient Office Visit  Subjective    Patient ID: Sara Hansen, female    DOB: October 13, 1971  Age: 51 y.o. MRN: [id number]  CC:  Chief Complaint  Patient presents with   Annual Exam    HPI Sara Hansen presents for wellness.   Diet: well balance Exercise: walking    Family hx DM: mom and dad  HTN: dad, sisters   Colon cancer screening: completed in 2022 and was negative with repeat due in 2025 Mammogram: screening done 2023--normal  Pap smear: negative 2022 due in 2027    Smoking: no Alcohol use: no Illicit drug use: no    Outpatient Encounter Medications as of 08/28/2022  Medication Sig   Risankizumab-rzaa,150 MG Dose, (SKYRIZI, 150 MG DOSE,) 75 MG/0.83ML PSKT Inject into the skin.   benzonatate (TESSALON) 200 MG capsule Take 1 capsule (200 mg total) by mouth 3 (three) times daily as needed for cough. (Patient not taking: Reported on 08/28/2022)   betamethasone dipropionate 0.05 % cream Apply topically 2 (two) times daily. To affected area(s) as needed (Patient not taking: Reported on 08/28/2022)   famotidine (PEPCID) 40 MG tablet Take 0.5 tablets (20 mg total) by mouth 2 (two) times daily. (Patient not taking: Reported on 08/28/2022)   halobetasol (ULTRAVATE) 0.05 % ointment  (Patient not taking: Reported on 08/28/2022)   hydrOXYzine (ATARAX/VISTARIL) 25 MG tablet Take 1-2 tablets (25-50 mg total) by mouth every 8 (eight) hours as needed for itching. (Patient not taking: Reported on 08/28/2022)   meloxicam (MOBIC) 7.5 MG tablet Take 1 tablet (7.5 mg total) by mouth daily.   Multiple Vitamin (MULTI-VITAMIN) tablet Take by mouth. (Patient not taking: Reported on 08/28/2022)   omeprazole (PRILOSEC) 40 MG capsule Take 1 capsule (40 mg total) by mouth daily. For 6-8 weeks (Patient not taking: Reported on 08/28/2022)   [DISCONTINUED] meloxicam (MOBIC) 7.5 MG tablet TAKE 1 TABLET BY MOUTH ONCE DAILY (Patient not taking: Reported on 08/28/2022)   No facility-administered encounter medications on  file as of 08/28/2022.    Past Medical History:  Diagnosis Date   GERD (gastroesophageal reflux disease) 09/20/2015   Right knee pain 09/20/2015    No past surgical history on file.  No family history on file.  Social History   Socioeconomic History   Marital status: Married    Spouse name: Not on file   Number of children: Not on file   Years of education: Not on file   Highest education level: Not on file  Occupational History   Not on file  Tobacco Use   Smoking status: Never   Smokeless tobacco: Never  Substance and Sexual Activity   Alcohol use: Yes    Alcohol/week: 0.0 standard drinks of alcohol   Drug use: No   Sexual activity: Not on file  Other Topics Concern   Not on file  Social History Narrative   Not on file   Social Determinants of Health   Financial Resource Strain: Not on file  Food Insecurity: Not on file  Transportation Needs: Not on file  Physical Activity: Not on file  Stress: Not on file  Social Connections: Not on file  Intimate Partner Violence: Not on file    Review of Systems  Constitutional:  Negative for chills and fever.  Respiratory:  Negative for cough and shortness of breath.   Cardiovascular:  Negative for chest pain.  Neurological:  Negative for headaches.        Objective    BP (!) 168/92  Pulse 82   Resp (!) 97   Ht 5\' 1"  (1.549 m)   Wt 134 lb (60.8 kg)   BMI 25.32 kg/m   Physical Exam Vitals and nursing note reviewed.  Constitutional:      General: She is not in acute distress.    Appearance: Normal appearance.  HENT:     Head: Normocephalic and atraumatic.     Right Ear: External ear normal.     Left Ear: External ear normal.     Nose: Nose normal.  Eyes:     Conjunctiva/sclera: Conjunctivae normal.  Cardiovascular:     Rate and Rhythm: Normal rate and regular rhythm.  Pulmonary:     Effort: Pulmonary effort is normal.     Breath sounds: Normal breath sounds.  Abdominal:     General: Abdomen is  flat. Bowel sounds are normal.     Palpations: Abdomen is soft.  Musculoskeletal:     Comments: 5/5 muscle strength upper and lower extremity bilaterally  Neurological:     General: No focal deficit present.     Mental Status: She is alert and oriented to person, place, and time.  Psychiatric:        Mood and Affect: Mood normal.        Behavior: Behavior normal.        Thought Content: Thought content normal.        Judgment: Judgment normal.         Assessment & Plan:   Problem List Items Addressed This Visit       Musculoskeletal and Integument   Iliotibial band syndrome of right side    - pt needed medication refill on mobic sent to pharmacy      Relevant Medications   meloxicam (MOBIC) 7.5 MG tablet     Other   Routine adult health maintenance - Primary    - up to date on health maintenance: mammo, pap, colon cancer screening  - CBC, CMP, lipid panel ordered today  - blood pressure elevated on exam with repeat of 151/81 will schedule two week blood pressure visit. If elevated at that time will need to start BP medicaton - pt wants to get flu shot when combined with rsv in a few months - discussed shingrix shot and pt is not yet ready to get it. Discussed risks vs benefits of vaccine.       Relevant Orders   Lipid panel   CBC   COMPLETE METABOLIC PANEL WITH GFR    Return in about 2 weeks (around 09/11/2022) for blood pressure visit.   09/13/2022, DO

## 2022-08-28 ENCOUNTER — Ambulatory Visit (INDEPENDENT_AMBULATORY_CARE_PROVIDER_SITE_OTHER): Payer: 59 | Admitting: Family Medicine

## 2022-08-28 ENCOUNTER — Encounter: Payer: Self-pay | Admitting: Family Medicine

## 2022-08-28 VITALS — BP 168/92 | HR 82 | Resp 97 | Ht 61.0 in | Wt 134.0 lb

## 2022-08-28 DIAGNOSIS — M7631 Iliotibial band syndrome, right leg: Secondary | ICD-10-CM

## 2022-08-28 DIAGNOSIS — Z0000 Encounter for general adult medical examination without abnormal findings: Secondary | ICD-10-CM | POA: Diagnosis not present

## 2022-08-28 MED ORDER — MELOXICAM 7.5 MG PO TABS: 7.5000 mg | ORAL_TABLET | Freq: Every day | ORAL | 0 refills | Status: AC

## 2022-08-28 NOTE — Assessment & Plan Note (Signed)
-   up to date on health maintenance: mammo, pap, colon cancer screening  - CBC, CMP, lipid panel ordered today  - blood pressure elevated on exam with repeat of 151/81 will schedule two week blood pressure visit. If elevated at that time will need to start BP medicaton - pt wants to get flu shot when combined with rsv in a few months - discussed shingrix shot and pt is not yet ready to get it. Discussed risks vs benefits of vaccine.

## 2022-08-28 NOTE — Assessment & Plan Note (Signed)
-   pt needed medication refill on mobic sent to pharmacy

## 2022-08-29 LAB — LIPID PANEL
Cholesterol: 269 mg/dL — ABNORMAL HIGH (ref <200)
HDL: 70 mg/dL (ref >=50)
LDL Cholesterol (Calc): 161 mg/dL (calc) — ABNORMAL HIGH
Non-HDL Cholesterol (Calc): 199 mg/dL (calc) — ABNORMAL HIGH (ref <130)
Total CHOL/HDL Ratio: 3.8 (calc) (ref <5.0)
Triglycerides: 211 mg/dL — ABNORMAL HIGH (ref <150)

## 2022-08-29 LAB — COMPLETE METABOLIC PANEL WITH GFR
AG Ratio: 1.5 (calc) (ref 1.0–2.5)
ALT: 30 U/L — ABNORMAL HIGH (ref 6–29)
AST: 21 U/L (ref 10–35)
Albumin: 4.4 g/dL (ref 3.6–5.1)
Alkaline phosphatase (APISO): 151 U/L (ref 37–153)
BUN: 12 mg/dL (ref 7–25)
CO2: 30 mmol/L (ref 20–32)
Calcium: 9.7 mg/dL (ref 8.6–10.4)
Chloride: 103 mmol/L (ref 98–110)
Creat: 0.58 mg/dL (ref 0.50–1.03)
Globulin: 2.9 g/dL (calc) (ref 1.9–3.7)
Glucose, Bld: 94 mg/dL (ref 65–99)
Potassium: 4.7 mmol/L (ref 3.5–5.3)
Sodium: 141 mmol/L (ref 135–146)
Total Bilirubin: 0.8 mg/dL (ref 0.2–1.2)
Total Protein: 7.3 g/dL (ref 6.1–8.1)
eGFR: 110 mL/min/{1.73_m2} (ref >=60)

## 2022-08-29 LAB — CBC
HCT: 46 % — ABNORMAL HIGH (ref 35.0–45.0)
Hemoglobin: 15.1 g/dL (ref 11.7–15.5)
MCH: 29.4 pg (ref 27.0–33.0)
MCHC: 32.8 g/dL (ref 32.0–36.0)
MCV: 89.5 fL (ref 80.0–100.0)
MPV: 9 fL (ref 7.5–12.5)
Platelets: 319 10*3/uL (ref 140–400)
RBC: 5.14 10*6/uL — ABNORMAL HIGH (ref 3.80–5.10)
RDW: 13.1 % (ref 11.0–15.0)
WBC: 5.7 10*3/uL (ref 3.8–10.8)

## 2022-09-11 ENCOUNTER — Ambulatory Visit: Payer: 59 | Admitting: Family Medicine

## 2022-09-17 ENCOUNTER — Encounter: Payer: Self-pay | Admitting: Family Medicine

## 2022-09-17 ENCOUNTER — Ambulatory Visit: Payer: 59 | Admitting: Family Medicine

## 2022-09-17 DIAGNOSIS — I10 Essential (primary) hypertension: Secondary | ICD-10-CM | POA: Diagnosis not present

## 2022-09-17 MED ORDER — LISINOPRIL 5 MG PO TABS: 5.0000 mg | ORAL_TABLET | Freq: Every day | ORAL | 3 refills | Status: AC

## 2022-09-17 NOTE — Progress Notes (Signed)
Established patient visit   Patient: Sara Hansen   DOB: [birth year]   51 y.o. Female  MRN: [id number] Visit Date: 09/17/2022  Today's healthcare provider: Charlton Amor, DO   Chief Complaint  Patient presents with   Follow-up    SUBJECTIVE    Chief Complaint  Patient presents with   Follow-up   HPI  Pt presents for blood pressure follow up. At wellness visit a few weeks ago patient was found to have elevated BP on arrival as well as on repeat testing. BP today is 148/92. She denies chest pain, shortness of breath, blurry vision.   Review of Systems  Constitutional:  Negative for activity change, fatigue and fever.  Respiratory:  Negative for cough and shortness of breath.   Cardiovascular:  Negative for chest pain.  Gastrointestinal:  Negative for abdominal pain.  Genitourinary:  Negative for difficulty urinating.      Current Meds  Medication Sig   benzonatate (TESSALON) 200 MG capsule Take 1 capsule (200 mg total) by mouth 3 (three) times daily as needed for cough.   betamethasone dipropionate 0.05 % cream Apply topically 2 (two) times daily. To affected area(s) as needed   famotidine (PEPCID) 40 MG tablet Take 0.5 tablets (20 mg total) by mouth 2 (two) times daily.   halobetasol (ULTRAVATE) 0.05 % ointment    hydrOXYzine (ATARAX/VISTARIL) 25 MG tablet Take 1-2 tablets (25-50 mg total) by mouth every 8 (eight) hours as needed for itching.   lisinopril (ZESTRIL) 5 MG tablet Take 1 tablet (5 mg total) by mouth daily.   meloxicam (MOBIC) 7.5 MG tablet Take 1 tablet (7.5 mg total) by mouth daily.   Multiple Vitamin (MULTI-VITAMIN) tablet Take by mouth.   omeprazole (PRILOSEC) 40 MG capsule Take 1 capsule (40 mg total) by mouth daily. For 6-8 weeks   Risankizumab-rzaa,150 MG Dose, (SKYRIZI, 150 MG DOSE,) 75 MG/0.83ML PSKT Inject into the skin.    OBJECTIVE    BP (!) 148/92   Pulse 93   Ht 5\' 1"  (1.549 m)   Wt 135 lb (61.2 kg)   SpO2 99%   BMI 25.51 kg/m    Physical Exam Vitals and nursing note reviewed.  Constitutional:      General: She is not in acute distress.    Appearance: Normal appearance.  HENT:     Head: Normocephalic and atraumatic.     Right Ear: External ear normal.     Left Ear: External ear normal.     Nose: Nose normal.  Eyes:     Conjunctiva/sclera: Conjunctivae normal.  Cardiovascular:     Rate and Rhythm: Normal rate and regular rhythm.  Pulmonary:     Effort: Pulmonary effort is normal.     Breath sounds: Normal breath sounds.  Neurological:     General: No focal deficit present.     Mental Status: She is alert and oriented to person, place, and time.  Psychiatric:        Mood and Affect: Mood normal.        Behavior: Behavior normal.        Thought Content: Thought content normal.        Judgment: Judgment normal.       ASSESSMENT & PLAN    Problem List Items Addressed This Visit       Cardiovascular and Mediastinum   Hypertension    - blood pressure elevated. Will start patient on lisinopril 5mg   - follow up in 4 weeks for  blood pressure follow up - CMP had good K level  - discussed potential side effects of lisinopril       Relevant Medications   lisinopril (ZESTRIL) 5 MG tablet    Return in about 4 weeks (around 10/15/2022).      Meds ordered this encounter  Medications   lisinopril (ZESTRIL) 5 MG tablet    Sig: Take 1 tablet (5 mg total) by mouth daily.    Dispense:  30 tablet    Refill:  3    No orders of the defined types were placed in this encounter.    Owens Loffler, DO  [facility] [phone] (phone) [phone] (fax)  [place]

## 2022-09-17 NOTE — Assessment & Plan Note (Signed)
-   blood pressure elevated. Will start patient on lisinopril 5mg   - follow up in 4 weeks for blood pressure follow up - CMP had good K level  - discussed potential side effects of lisinopril

## 2022-10-16 ENCOUNTER — Ambulatory Visit: Payer: 59 | Admitting: Family Medicine

## 2023-05-06 ENCOUNTER — Other Ambulatory Visit: Payer: Self-pay | Admitting: Family Medicine

## 2023-05-06 DIAGNOSIS — M7631 Iliotibial band syndrome, right leg: Secondary | ICD-10-CM

## 2023-05-08 ENCOUNTER — Encounter: Payer: Self-pay | Admitting: Family Medicine

## 2023-05-11 ENCOUNTER — Other Ambulatory Visit: Payer: Self-pay

## 2023-05-11 DIAGNOSIS — M7631 Iliotibial band syndrome, right leg: Secondary | ICD-10-CM

## 2024-04-28 ENCOUNTER — Encounter: Payer: Self-pay | Admitting: Family Medicine
